# Patient Record
Sex: Female | Born: 1970 | Race: Asian | Hispanic: No | Marital: Married | Smoking: Never smoker
Health system: Southern US, Community
[De-identification: ages and names within clinical notes are randomized; demographics above are authoritative.]

## PROBLEM LIST (undated history)

## (undated) DIAGNOSIS — T7840XA Allergy, unspecified, initial encounter: Secondary | ICD-10-CM

## (undated) DIAGNOSIS — E119 Type 2 diabetes mellitus without complications: Secondary | ICD-10-CM

## (undated) HISTORY — DX: Allergy, unspecified, initial encounter: T78.40XA

## (undated) HISTORY — DX: Type 2 diabetes mellitus without complications: E11.9

---

## 1997-05-05 ENCOUNTER — Inpatient Hospital Stay (HOSPITAL_COMMUNITY): Admission: AD | Admit: 1997-05-05 | Discharge: 1997-05-07 | Payer: Self-pay | Admitting: *Deleted

## 1998-09-26 ENCOUNTER — Other Ambulatory Visit: Admission: RE | Admit: 1998-09-26 | Discharge: 1998-09-26 | Payer: Self-pay | Admitting: Obstetrics and Gynecology

## 1999-12-24 ENCOUNTER — Other Ambulatory Visit: Admission: RE | Admit: 1999-12-24 | Discharge: 1999-12-24 | Payer: Self-pay | Admitting: Obstetrics and Gynecology

## 2001-09-16 ENCOUNTER — Other Ambulatory Visit: Admission: RE | Admit: 2001-09-16 | Discharge: 2001-09-16 | Payer: Self-pay | Admitting: Obstetrics and Gynecology

## 2002-04-09 ENCOUNTER — Inpatient Hospital Stay (HOSPITAL_COMMUNITY): Admission: AD | Admit: 2002-04-09 | Discharge: 2002-04-11 | Payer: Self-pay | Admitting: Obstetrics and Gynecology

## 2002-05-19 ENCOUNTER — Other Ambulatory Visit: Admission: RE | Admit: 2002-05-19 | Discharge: 2002-05-19 | Payer: Self-pay | Admitting: Obstetrics and Gynecology

## 2003-07-07 ENCOUNTER — Other Ambulatory Visit: Admission: RE | Admit: 2003-07-07 | Discharge: 2003-07-07 | Payer: Self-pay | Admitting: Obstetrics and Gynecology

## 2008-09-12 ENCOUNTER — Ambulatory Visit (HOSPITAL_COMMUNITY): Admission: RE | Admit: 2008-09-12 | Discharge: 2008-09-12 | Payer: Self-pay | Admitting: Obstetrics and Gynecology

## 2009-11-27 IMAGING — RF DG HYSTEROGRAM
6 series · 6 of 6 positions shown · IV contrast (omnipaque)
Comparison: none

CLINICAL DATA: 3-month status post Essure micro-insert for
contraception.  Evaluate tubal patency.

HYSTEROSALPINGOGRAM
TECHNIQUE: Following cleansing of the cervix and vagina with
Betadine solution, a hysterosalpingogram was performed using a 5-
French hysterosalpingogram catheter and Omnipaque 300 contrast. The
patient tolerated the examination without difficulty.
Fluoroscopy time: 0.9 minutes.

[Series 1: run · 1 of 1 slices shown (1 of 6)]
[im 1/1]
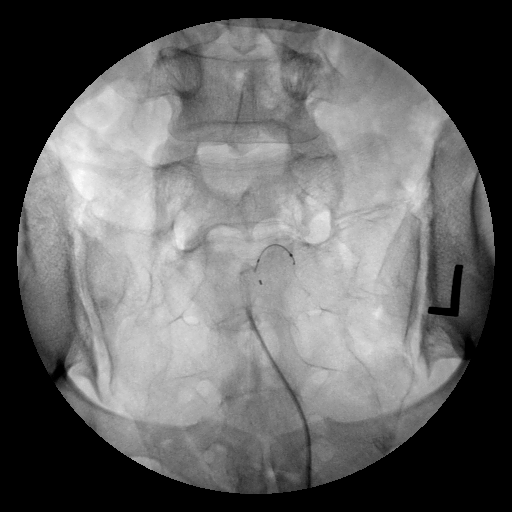

[Series 2: run · 1 of 1 slices shown (2 of 6)]
[im 1/1]
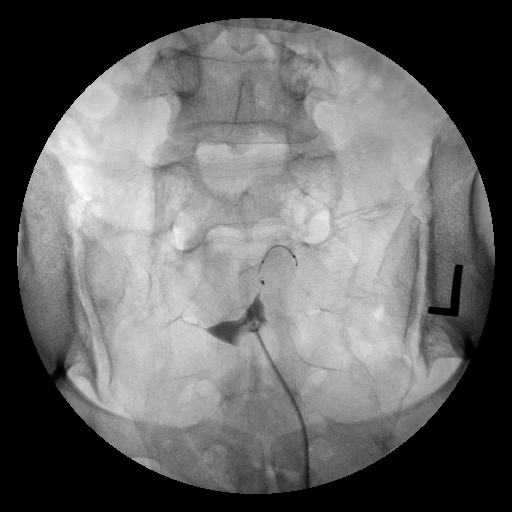

[Series 3: run · 1 of 1 slices shown (3 of 6)]
[im 1/1]
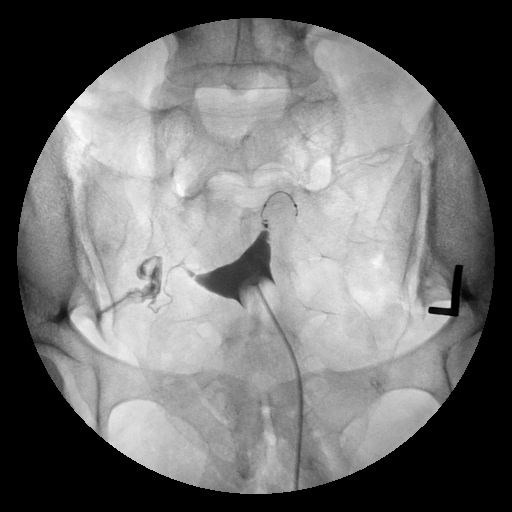

[Series 4: run · 1 of 1 slices shown (4 of 6)]
[im 1/1]
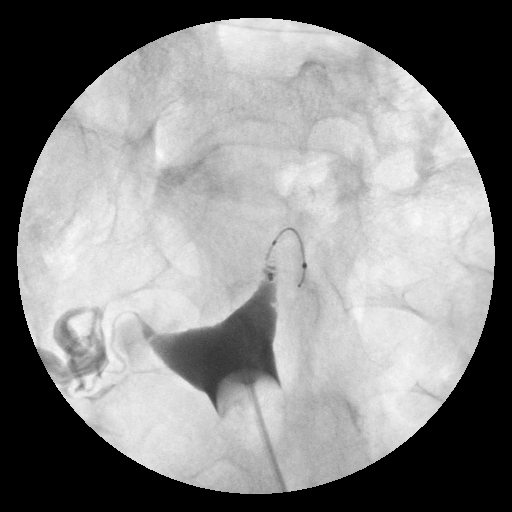

[Series 5: run · 1 of 1 slices shown (5 of 6)]
[im 1/1]
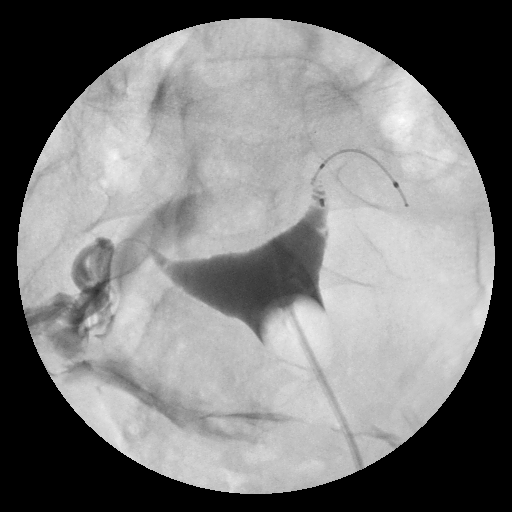

[Series 6: run · 1 of 1 slices shown (6 of 6)]
[im 1/1]
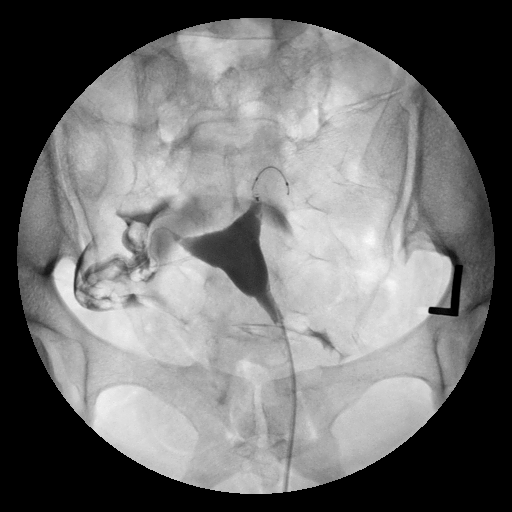

[6 of 6 positions shown; findings below may reference images not displayed]

FINDINGS: The left sided Essure micro-insert is in satisfactory
location. The left fallopian tube is occluded at the cornua
(category 1).

No right-sided Essure micro-insert is visualized within the
fallopian tube, uterus or adnexal regions.  This is consistent with
complete expulsion. Complete opacification of the right fallopian
is seen, with intraperitoneal spill of contrast from the right
fallopian tube.
IMPRESSION: 1.  Complete expulsion of the right-sided Essure micro-insert.
Right fallopian tube remains patent.
2.  Satisfactory location of left-sided Essure micro-insert, with
occlusion of the left fallopian tube at the cornua.

Test results telephoned to Dr. [REDACTED] nurse Ladebe at the
time of interpretation on 09/12/2008 at 1741 hours. Test result
also discussed with the patient at the time of the exam.

## 2010-08-17 NOTE — H&P (Signed)
NAME:  Paula Baker, Paula Baker                              ACCOUNT NO.:  [id number]   MEDICAL RECORD NO.:  [id number]                   PATIENT TYPE:  INP   LOCATION:  9163                                 FACILITY:  [facility]   PHYSICIAN:  Genia Del, M.D.             DATE OF BIRTH:  1971/02/27   DATE OF ADMISSION:  04/09/2002  DATE OF DISCHARGE:                                HISTORY & PHYSICAL   INTRODUCTION:  The patient is a 40 year old G2 P1 at 39+ weeks gestation for  an expected date of delivery by ultrasound on April 11, 2002.   REASON FOR ADMISSION:  Regular uterine contractions every five minutes since  4 a.m. on April 09, 2002.   HISTORY OF PRESENT ILLNESS:  Increasing uterine contractions, getting every  five minutes and intense at 4 a.m. on April 09, 2002.  No fluid leak, no  vaginal bleeding.  Good fetal movements, no PIH symptoms.   PAST MEDICAL HISTORY:  Negative.   PAST SURGICAL HISTORY:  Negative.   PAST OBSTETRICAL HISTORY:  In February 1999, 40 weeks spontaneous vaginal  delivery, baby girl, 6 pounds 7 ounces, no complications.   PAST GYNECOLOGICAL HISTORY:  Negative.   FAMILY HISTORY:  Hypercholesterolemia in sister and diabetes mellitus type 2  in mother.   ALLERGIES:  AMPICILLIN - rash.   MEDICATIONS:  Prenate vitamins.   SOCIAL HISTORY:  Married, nonsmoker.   HISTORY OF PRESENT PREGNANCY:  First trimester was normal.  Labs showed  hemoglobin at 11.8, platelets 322.  Blood type Rh A positive, Rh antibodies  negative.  Sickle cell trait positive.  RPR nonreactive.  HbsAg negative.  HIV nonreactive.  Rubella titers immune.  Given that the patient was  uncertain of last menstrual period an ultrasound was done at 10+ weeks for  an expected date of delivery per ultrasound on April 11, 2002.  Fetal  heart rate was 181 and regular.  It was a single intrauterine pregnancy.  In  the second trimester, the triple test was within normal limits at 15+ weeks.  An  ultrasound done at 19+ weeks showed a review of anatomy within normal  limits.  Cervix was 6.24, closed; placenta was posterior, normal; amniotic  fluid within normal limits.  The estimated fetal weight was at the 35th  percentile.  In the third trimester, one-hour glucose test was increased at  27+ weeks.  Therefore, a three-hour test was done and was within normal  limits with only one abnormal value - at one hour.  An ultrasound done at  27+ weeks revealed a 37th percentile for estimated fetal weight; amniotic  fluid was at the 85th percentile.  The patient discussed postpartum  bilateral tubal sterilization at 31 weeks with her primary, Dr. Billy Coast.  Then, at 33+ weeks, an ultrasound for interval growth revealed an estimated  fetal weight at the 23rd percentile; amniotic fluid was at the  52nd  percentile.  At 35+ weeks, group B strep was negative.  Blood pressures  remained normal throughout pregnancy.   REVIEW OF SYSTEMS:  CONSTITUTIONAL:  Negative.  HEENT:  Negative.  CARDIOVASCULAR:  Negative.  RESPIRATORY:  Negative.  GI/URO:  Negative.  DERMATOLOGIC, NEUROLOGIC, ENDOCRINE:  Negative.   PHYSICAL EXAMINATION:  GENERAL:  No apparent distress except for pain with  uterine contractions.  VITAL SIGNS:  Blood pressure normal, pulse regular, respiratory rate 20, and  temperature normal.  LUNGS:  Clear bilaterally.  HEART:  Regular cardiac rhythm.  ABDOMEN:  Gravid, cephalic presentation.  PELVIC:  Vaginal exam on admission 6 cm, 90% effaced, vertex, -2, with  intact membranes.  EXTREMITIES:  Lower limbs normal.  MONITORING:  Fetal heart rate 130s with good variability, accelerations  present; occasional rare mild variable decelerations with uterine  contractions.  Uterine contractions every three minutes, lasting 60 seconds,  moderate in intensity.   IMPRESSION:  1. G2 P1, 39 weeks five days gestation with spontaneous labor.  2. Group B strep negative.  3. Fetal heart rate  monitoring reassuring.   PLAN:  1. Admit to labor and delivery.  2. Monitoring.  3. Expectant management with probable vaginal delivery.                                               Genia Del, M.D.    ML/MEDQ  D:  04/09/2002  T:  04/09/2002  Job:  [id number]

## 2014-06-23 ENCOUNTER — Other Ambulatory Visit: Payer: Self-pay | Admitting: Family Medicine

## 2014-06-23 DIAGNOSIS — E041 Nontoxic single thyroid nodule: Secondary | ICD-10-CM

## 2014-07-01 ENCOUNTER — Ambulatory Visit
Admission: RE | Admit: 2014-07-01 | Discharge: 2014-07-01 | Disposition: A | Discharge status: Home / Self Care | Payer: 59 | Source: Ambulatory Visit | Attending: Family Medicine | Admitting: Family Medicine

## 2014-07-01 DIAGNOSIS — E041 Nontoxic single thyroid nodule: Secondary | ICD-10-CM

## 2015-06-07 ENCOUNTER — Other Ambulatory Visit: Payer: Self-pay

## 2015-06-07 DIAGNOSIS — Z1231 Encounter for screening mammogram for malignant neoplasm of breast: Secondary | ICD-10-CM

## 2015-06-21 ENCOUNTER — Ambulatory Visit
Admission: RE | Admit: 2015-06-21 | Discharge: 2015-06-21 | Disposition: A | Discharge status: Home / Self Care | Payer: 59 | Source: Ambulatory Visit

## 2015-06-21 DIAGNOSIS — Z1231 Encounter for screening mammogram for malignant neoplasm of breast: Secondary | ICD-10-CM

## 2015-09-15 IMAGING — US US SOFT TISSUE HEAD/NECK
1 series · 14 of 25 positions shown · non-contrast
Comparison: None.

CLINICAL DATA: Palpable left thyroid nodule

EXAM:
THYROID ULTRASOUND
TECHNIQUE: Ultrasound examination of the thyroid gland and adjacent soft
tissues was performed.

[Series 1: us soft tissue head/neck · 0.06mm/px · 14 of 35 slices shown]
[im 1/35]
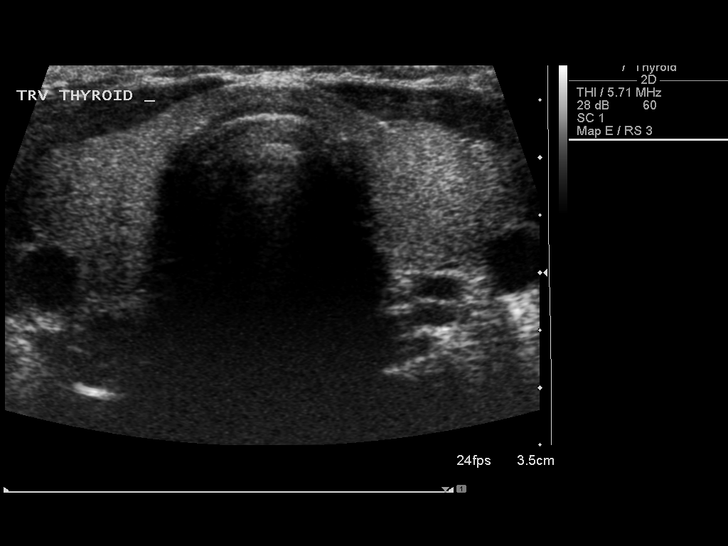
[im 3/35]
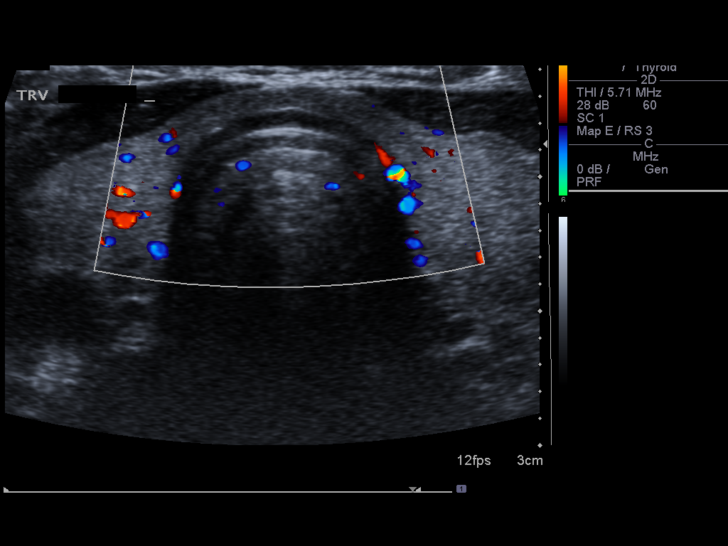
[im 6/35]
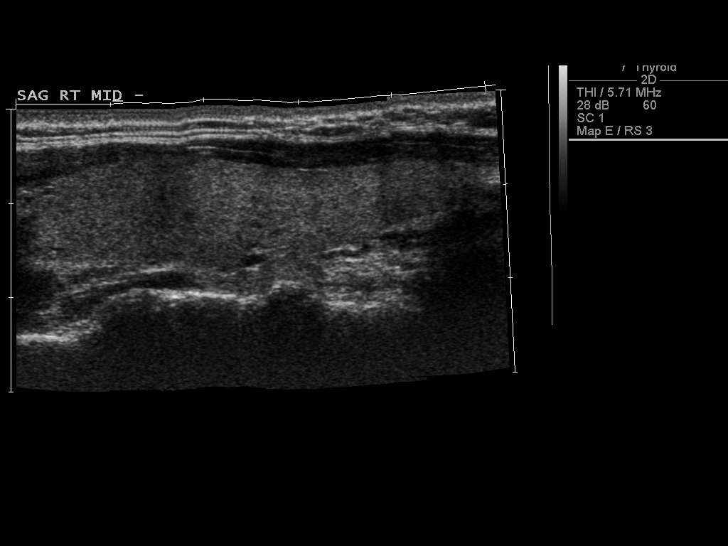
[im 9/35]
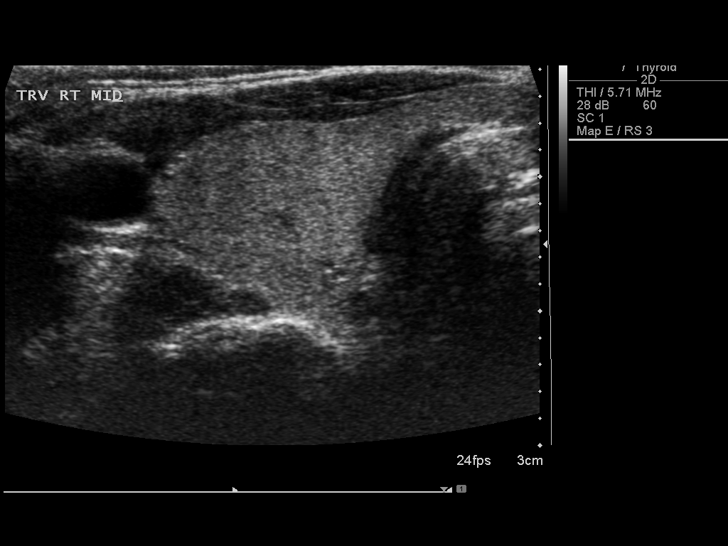
[im 12/35]
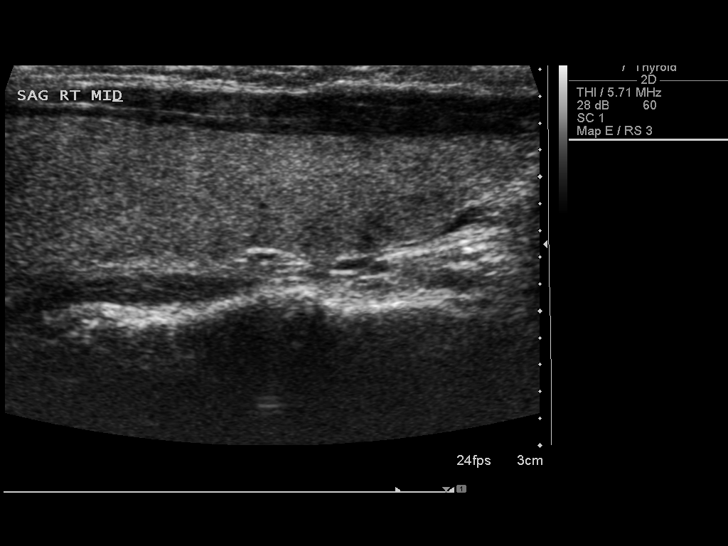
[im 13/35]
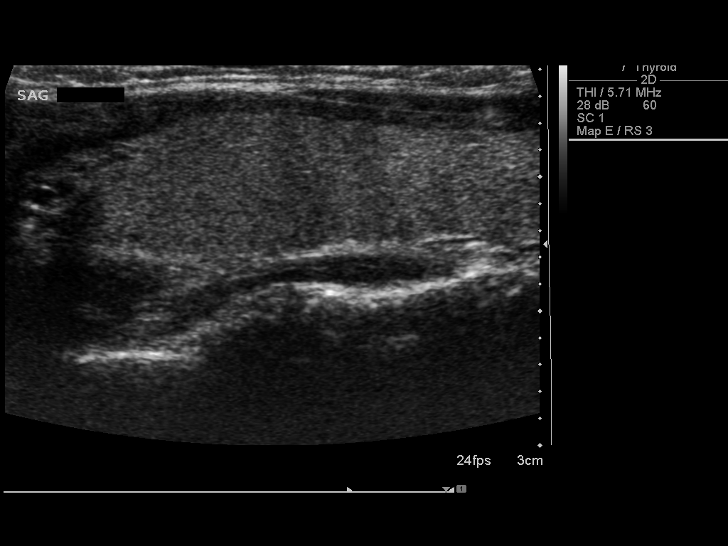
[im 16/35]
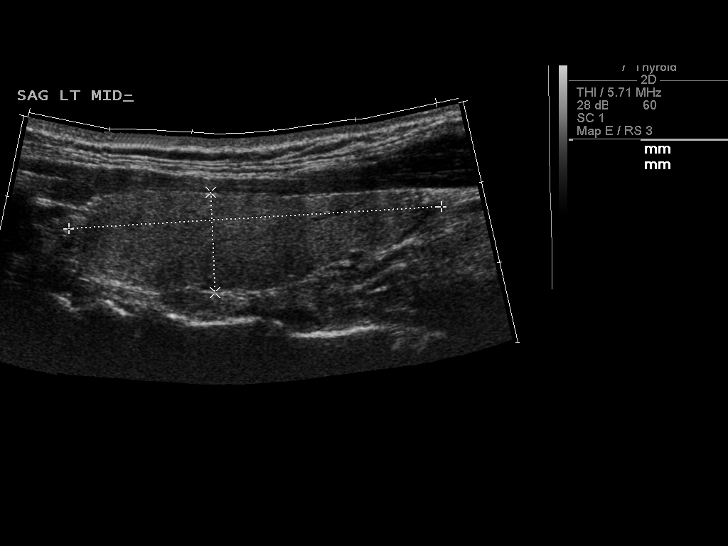
[im 19/35]
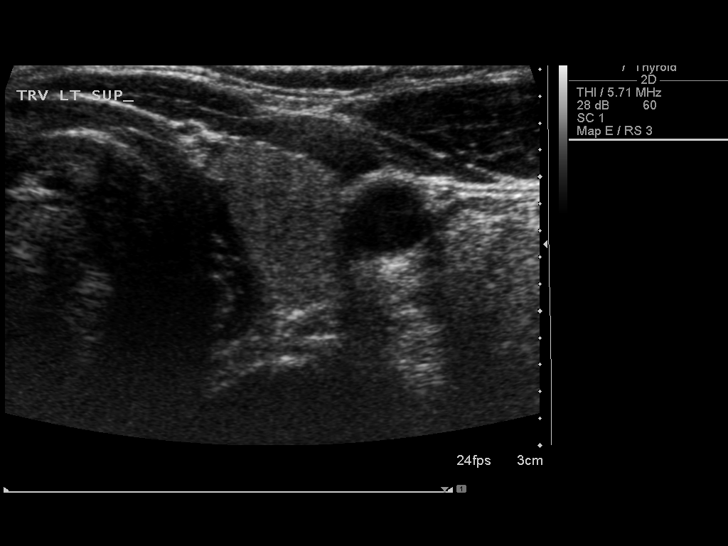
[im 22/35]
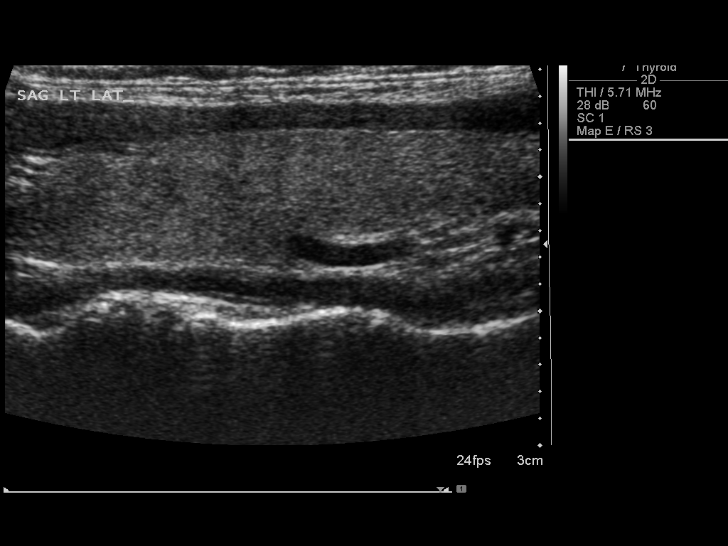
[im 23/35]
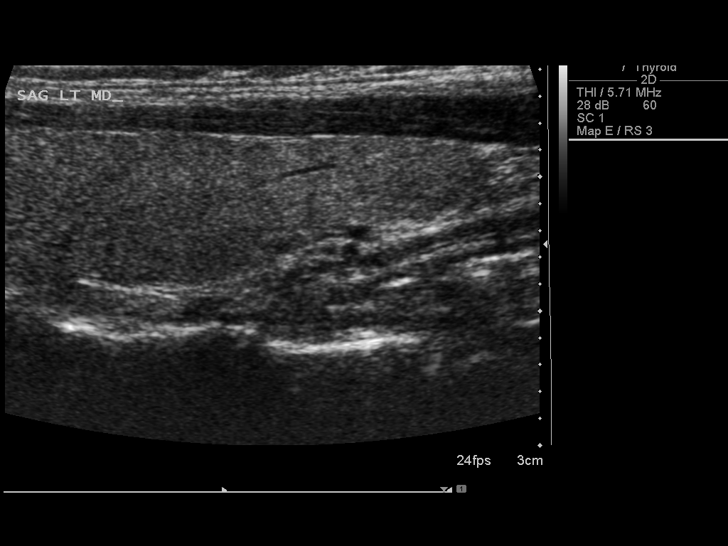
[im 26/35]
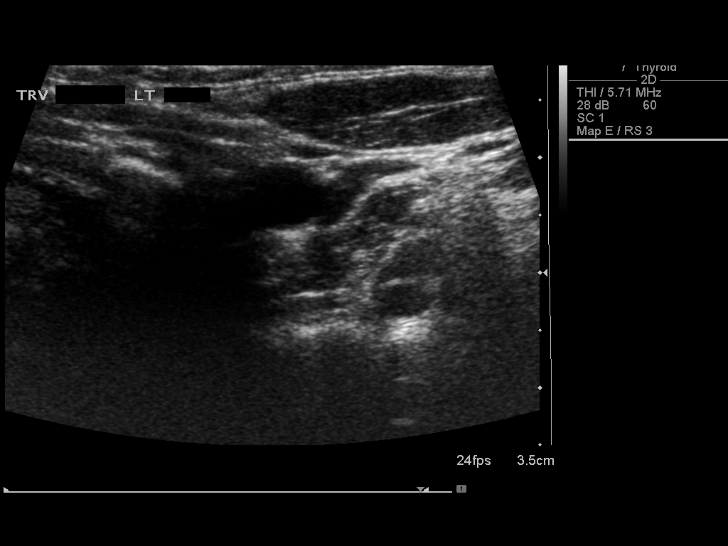
[im 29/35]
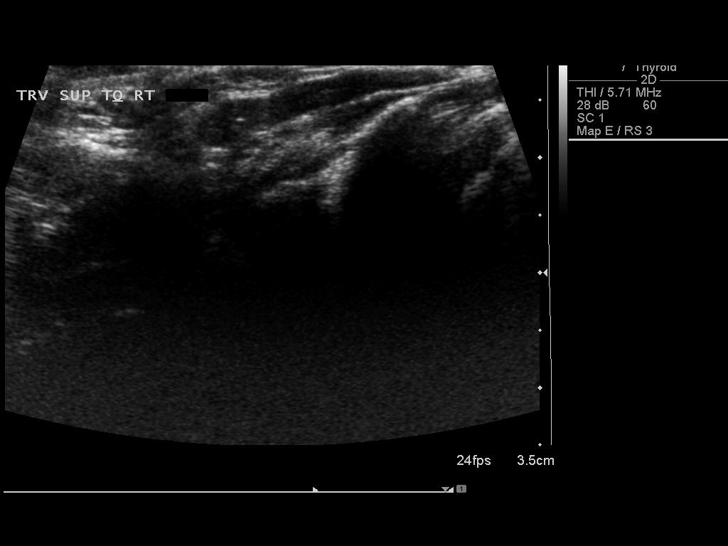
[im 32/35]
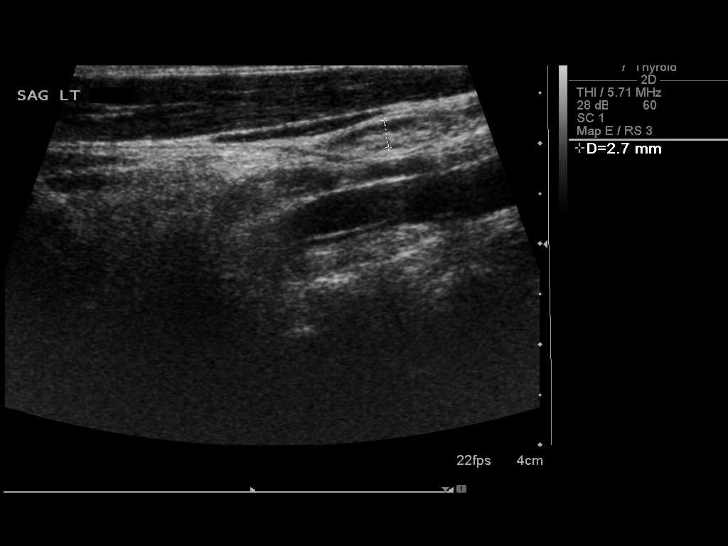
[im 35/35]
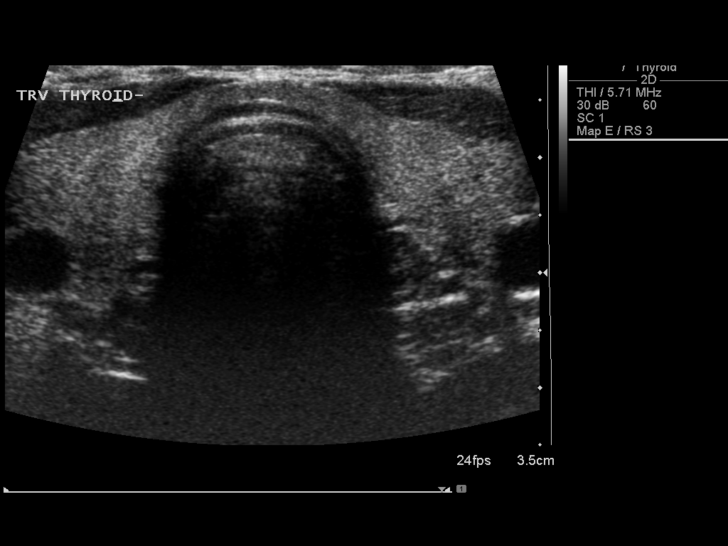

[14 of 25 positions shown; findings below may reference images not displayed]

FINDINGS: Right thyroid lobe

Measurements: 4.9 x 1.3 x 1.6 cm.  No nodules visualized.

Left thyroid lobe

Measurements: 4.5 x 1.2 x 1.6 cm.  No nodules visualized.

Isthmus

Thickness: 3 mm.  No nodules visualized.

Lymphadenopathy

None visualized.
IMPRESSION: No evidence of nodule.  Within normal limits.

## 2016-06-10 DIAGNOSIS — Z01419 Encounter for gynecological examination (general) (routine) without abnormal findings: Secondary | ICD-10-CM | POA: Diagnosis not present

## 2016-06-10 DIAGNOSIS — Z6821 Body mass index (BMI) 21.0-21.9, adult: Secondary | ICD-10-CM | POA: Diagnosis not present

## 2016-07-09 DIAGNOSIS — E785 Hyperlipidemia, unspecified: Secondary | ICD-10-CM | POA: Diagnosis not present

## 2016-07-09 DIAGNOSIS — Z0000 Encounter for general adult medical examination without abnormal findings: Secondary | ICD-10-CM | POA: Diagnosis not present

## 2022-01-23 DIAGNOSIS — R7303 Prediabetes: Secondary | ICD-10-CM | POA: Insufficient documentation

## 2022-06-12 LAB — HM MAMMOGRAPHY

## 2022-09-25 ENCOUNTER — Encounter: Payer: Self-pay | Admitting: Internal Medicine

## 2022-09-25 ENCOUNTER — Ambulatory Visit (INDEPENDENT_AMBULATORY_CARE_PROVIDER_SITE_OTHER): Payer: 59 | Admitting: Internal Medicine

## 2022-09-25 VITALS — BP 100/60 | HR 70 | Temp 98.0°F | Ht 61.0 in | Wt 97.0 lb

## 2022-09-25 DIAGNOSIS — E782 Mixed hyperlipidemia: Secondary | ICD-10-CM

## 2022-09-25 DIAGNOSIS — R7303 Prediabetes: Secondary | ICD-10-CM | POA: Diagnosis not present

## 2022-09-25 LAB — CBC
HCT: 38.9 % (ref 36.0–46.0)
Hemoglobin: 12.5 g/dL (ref 12.0–15.0)
MCHC: 32.2 g/dL (ref 30.0–36.0)
MCV: 78.1 fl (ref 78.0–100.0)
Platelets: 209 10*3/uL (ref 150.0–400.0)
RBC: 4.98 Mil/uL (ref 3.87–5.11)
RDW: 13.6 % (ref 11.5–15.5)
WBC: 5.5 10*3/uL (ref 4.0–10.5)

## 2022-09-25 LAB — COMPREHENSIVE METABOLIC PANEL
ALT: 31 U/L (ref 0–35)
AST: 31 U/L (ref 0–37)
Albumin: 4.1 g/dL (ref 3.5–5.2)
Alkaline Phosphatase: 62 U/L (ref 39–117)
BUN: 14 mg/dL (ref 6–23)
CO2: 29 mEq/L (ref 19–32)
Calcium: 9.8 mg/dL (ref 8.4–10.5)
Chloride: 101 mEq/L (ref 96–112)
Creatinine, Ser: 0.63 mg/dL (ref 0.40–1.20)
GFR: 103.22 mL/min (ref >60.00)
Glucose, Bld: 89 mg/dL (ref 70–99)
Potassium: 3.5 mEq/L (ref 3.5–5.1)
Sodium: 139 mEq/L (ref 135–145)
Total Bilirubin: 0.5 mg/dL (ref 0.2–1.2)
Total Protein: 7.3 g/dL (ref 6.0–8.3)

## 2022-09-25 LAB — LIPID PANEL
Cholesterol: 270 mg/dL — ABNORMAL HIGH (ref 0–200)
HDL: 73 mg/dL (ref >39.00)
LDL Cholesterol: 189 mg/dL — ABNORMAL HIGH (ref 0–99)
NonHDL: 196.66
Total CHOL/HDL Ratio: 4
Triglycerides: 40 mg/dL (ref 0.0–149.0)
VLDL: 8 mg/dL (ref 0.0–40.0)

## 2022-09-25 LAB — HEMOGLOBIN A1C: Hgb A1c MFr Bld: 6.1 % (ref 4.6–6.5)

## 2022-09-25 NOTE — Progress Notes (Unsigned)
   Subjective:   Patient ID: Paula Baker, female    DOB: Jun 30, 1970, 52 y.o.   MRN: [id number]  HPI The patient is a new 52 YO female coming in for  PMH, [facility], social history reviewed and updated  Review of Systems  Objective:  Physical Exam  Vitals:   09/25/22 0842  BP: 100/60  Pulse: 70  Temp: 98 F (36.7 C)  TempSrc: Oral  SpO2: 99%  Weight: 97 lb (44 kg)  Height: 5\' 1"  (1.549 m)    Assessment & Plan:

## 2022-09-25 NOTE — Patient Instructions (Signed)
Think about the shingles vaccine. 

## 2022-09-26 ENCOUNTER — Encounter: Payer: Self-pay | Admitting: Internal Medicine

## 2022-09-26 DIAGNOSIS — E782 Mixed hyperlipidemia: Secondary | ICD-10-CM

## 2022-09-26 HISTORY — DX: Mixed hyperlipidemia: E78.2

## 2022-09-26 NOTE — Assessment & Plan Note (Signed)
Checking lipid panel and adjust crestor 40 mg daily as needed for goal LDL<100 ideal or <130.

## 2022-09-26 NOTE — Assessment & Plan Note (Signed)
Strong family history of diabetes. Her prior PCP had her meet with nutritionist and she has made lifestyle changes. Checking HgA1c and adjust as needed.

## 2022-09-27 ENCOUNTER — Telehealth: Payer: Self-pay | Admitting: Internal Medicine

## 2022-09-27 NOTE — Telephone Encounter (Signed)
Patient is agreeable to increasing her cholesterol medication per lab notes from yesterday.  Please call patient and let her know when the rx as been sent to her pharmacy.  Phone:  [phone]

## 2022-09-27 NOTE — Telephone Encounter (Signed)
Dr Paula Baker approved medication for patient and patient has agreed as well. Please send in medication for patient.

## 2022-09-30 ENCOUNTER — Other Ambulatory Visit: Payer: Self-pay | Admitting: Family

## 2022-09-30 MED ORDER — ROSUVASTATIN CALCIUM 40 MG PO TABS: 40.0000 mg | ORAL_TABLET | Freq: Every day | ORAL | 1 refills | Status: AC

## 2022-10-01 NOTE — Telephone Encounter (Signed)
Called patient and informed her that her medication has been called into the pharmacy

## 2022-10-02 NOTE — Telephone Encounter (Signed)
Called pt she states the same dosage was sent to her pharmacy, but MD states she was going to change dosage. She states she been taking the Rosuvastatin 40 mg. Inform pt MD is out of the office until 10/14/22, and did not put a dose on med if was change. Pt states she will continue the 40 mg until she hear back on change.Marland KitchenRaechel Chute

## 2022-10-02 NOTE — Telephone Encounter (Signed)
Patient called and said she is confused. She thought her dosage for her rosuvastatin (CRESTOR) 40 MG tablet was supposed to be increased, but the same dosage she was already on was sent to the pharmacy. She would like to know if it was supposed to be increased or stay the same. Patient would like a call back at [phone].

## 2023-01-03 ENCOUNTER — Other Ambulatory Visit: Payer: Self-pay | Admitting: Internal Medicine

## 2023-01-03 DIAGNOSIS — Z1211 Encounter for screening for malignant neoplasm of colon: Secondary | ICD-10-CM

## 2023-01-03 DIAGNOSIS — Z1212 Encounter for screening for malignant neoplasm of rectum: Secondary | ICD-10-CM
